# Patient Record
Sex: Male | Born: 2005 | Race: Black or African American | Hispanic: No | Marital: Single | Smoking: Current every day smoker
Health system: Southern US, Community
[De-identification: ages and names within clinical notes are randomized; demographics above are authoritative.]

---
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
## 2024-08-02 ENCOUNTER — Ambulatory Visit: Payer: MEDICAID

## 2024-08-02 DIAGNOSIS — Z113 Encounter for screening for infections with a predominantly sexual mode of transmission: Secondary | ICD-10-CM | POA: Diagnosis not present

## 2024-08-02 LAB — HM HEPATITIS C SCREENING LAB: HM Hepatitis Screen: NEGATIVE

## 2024-08-02 LAB — HEPATITIS B SURFACE ANTIGEN: Hepatitis B Surface Ag: NONREACTIVE

## 2024-08-02 LAB — HM HIV SCREENING LAB: HM HIV Screening: NEGATIVE
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
## 2024-08-02 NOTE — Progress Notes (Signed)
 Pt is here for STD screening. Condoms declined. Sonda Primes, RN.

## 2024-08-04 ENCOUNTER — Ambulatory Visit: Payer: MEDICAID

## 2024-08-04 DIAGNOSIS — Z113 Encounter for screening for infections with a predominantly sexual mode of transmission: Secondary | ICD-10-CM

## 2024-08-04 NOTE — Progress Notes (Signed)
 Patient seen today for gonorrhea/chlamydia testing. He was seen on 10/14 and was tested for gonorrhea/chlamydia via urine, test is pending. Today he came to clinic requesting a penile swab. He reports burning with urination and that it smells like death down there. He has not noticed discharge. Brief genital exam with Ronnald Orange, CNA as a chaperone. He was reluctant to allow a complete genital exam and while he gave me permission to perform a gram stain/penile swab, when I would try and perform the procedure he repeatedly pulled away. He was afraid to allow me to perform the swab. I did not notice an odor specifically from his genital area, no lesions or discharge observed. Did not perform a complete genital exam given situation.  After several attempts and discussion, I left the room to allow him to consider what he would like to do. He then left the clinic.    Damien Satchel [facility]

## 2024-08-04 NOTE — Progress Notes (Signed)
 Pt is here for STD screening but refuse to be tested. Wilkie Drought, RN.

## 2024-08-05 ENCOUNTER — Ambulatory Visit: Payer: Self-pay

## 2024-08-05 LAB — C. TRACHOMATIS NAA, CONFIRM: C. trachomatis NAA, Confirm: POSITIVE — AB

## 2024-08-05 LAB — CHLAMYDIA/GC NAA, CONFIRMATION
Chlamydia trachomatis, NAA: POSITIVE — AB
Neisseria gonorrhoeae, NAA: POSITIVE — AB

## 2024-08-05 LAB — N. GONORRHOEAE NAA, CONFIRM: N. gonorrhoeae NAA, Confirm: POSITIVE — AB

## 2024-08-05 NOTE — Progress Notes (Signed)
 Reviewed. Positive for Gonorrhea AND Chlamydia.   Phone call to patient number. Message left with mother for patient to call [facility]. Patient called [facility] and RN explained that test results for gonorrhea and chlamydia are positive and treatment needed.  RN counseled no sex and need for partner to be treated.   STI tx appt scheduled for 08/08/2024 at 11 am [facility].  Oluwatoni Rotunno, RN

## 2024-08-08 ENCOUNTER — Ambulatory Visit: Payer: MEDICAID

## 2024-08-08 DIAGNOSIS — A749 Chlamydial infection, unspecified: Secondary | ICD-10-CM

## 2024-08-08 DIAGNOSIS — Z113 Encounter for screening for infections with a predominantly sexual mode of transmission: Secondary | ICD-10-CM

## 2024-08-08 DIAGNOSIS — A549 Gonococcal infection, unspecified: Secondary | ICD-10-CM

## 2024-08-08 MED ORDER — DOXYCYCLINE HYCLATE 100 MG PO TABS: 100.0000 mg | ORAL_TABLET | Freq: Two times a day (BID) | ORAL | Status: AC

## 2024-08-08 MED ORDER — CEFTRIAXONE SODIUM 500 MG IJ SOLR
500.0000 mg | Freq: Once | INTRAMUSCULAR | Status: AC
  Administered 2024-08-08: 500 mg via INTRAMUSCULAR

## 2024-08-08 NOTE — Progress Notes (Signed)
 In nurse clinic for chlamydia and gonorrhea treatment. Treated per S.O. Dr. Macario Ceftriaxone 500mg  IM x1. Tolerated well  L.Delt. Dispensed Doxycyline 100mg  BID x7days #14 pills. Advised to return to clinic in 3months for TOC.   The patient was dispensed Doxycycline 100mg  #14 today. I provided counseling today regarding the medication. We discussed the medication, the side effects and when to call clinic. Patient given the opportunity to ask questions. Questions answered.

## 2024-08-09 ENCOUNTER — Other Ambulatory Visit: Payer: MEDICAID | Admitting: Family Medicine

## 2024-08-09 DIAGNOSIS — Z202 Contact with and (suspected) exposure to infections with a predominantly sexual mode of transmission: Secondary | ICD-10-CM | POA: Diagnosis not present

## 2024-08-09 MED ORDER — METRONIDAZOLE 500 MG PO TABS: 2000.0000 mg | ORAL_TABLET | Freq: Once | ORAL | Status: AC

## 2024-08-09 NOTE — Progress Notes (Signed)
 Patient here with partner, who has tested positive for trichomonas. He was given the yellow contact card in the lobby. He is here for treatment. Was just seen in clinic and tested 10/16. Explained to both patient and his mother that we do not currently carry tests for trichomonas in males. All questions answered. No changes since last clinic visit 10/16. No new allergies.   Gen: awake, alert  Resp: normal effort, no distress Psych: appears confused, needs clarification from MD and mother regarding testing and contact to trichomonas. Mother does most of the question and answer.   Trichomonas contact -     metroNIDAZOLE ; Take 4 tablets (2,000 mg total) by mouth once for 1 dose.   Dorothyann Helling, MD 08/09/24  5:01 PM
